# Patient Record
Sex: Male | Born: 1980 | Race: Black or African American | Hispanic: No | Marital: Single | Smoking: Never smoker
Health system: Southern US, Community
[De-identification: ages and names within clinical notes are randomized; demographics above are authoritative.]

## PROBLEM LIST (undated history)

## (undated) DIAGNOSIS — U071 COVID-19: Secondary | ICD-10-CM

---
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
## 2008-08-02 ENCOUNTER — Emergency Department: Payer: Self-pay | Admitting: Emergency Medicine

## 2009-08-25 ENCOUNTER — Emergency Department: Payer: Self-pay | Admitting: Emergency Medicine

## 2009-09-14 ENCOUNTER — Emergency Department: Payer: Self-pay | Admitting: Emergency Medicine

## 2010-01-17 ENCOUNTER — Emergency Department: Payer: Self-pay | Admitting: Emergency Medicine

## 2010-02-24 ENCOUNTER — Emergency Department: Payer: Self-pay | Admitting: Emergency Medicine

## 2010-05-05 ENCOUNTER — Emergency Department: Payer: Self-pay | Admitting: Emergency Medicine

## 2013-03-02 ENCOUNTER — Emergency Department: Payer: Self-pay | Admitting: Internal Medicine

## 2013-12-02 ENCOUNTER — Emergency Department: Payer: Self-pay | Admitting: Emergency Medicine

## 2013-12-24 ENCOUNTER — Emergency Department: Payer: Self-pay | Admitting: Emergency Medicine

## 2016-06-03 ENCOUNTER — Emergency Department
Admission: EM | Admit: 2016-06-03 | Discharge: 2016-06-03 | Disposition: A | Discharge status: Home / Self Care | Payer: Self-pay | Attending: Emergency Medicine | Admitting: Emergency Medicine

## 2016-06-03 ENCOUNTER — Emergency Department: Payer: Self-pay

## 2016-06-03 DIAGNOSIS — N50819 Testicular pain, unspecified: Secondary | ICD-10-CM | POA: Insufficient documentation

## 2016-06-03 DIAGNOSIS — N50812 Left testicular pain: Secondary | ICD-10-CM

## 2016-06-03 LAB — COMPREHENSIVE METABOLIC PANEL
ALT: 18 U/L (ref 17–63)
ANION GAP: 8 (ref 5–15)
AST: 21 U/L (ref 15–41)
Albumin: 4.3 g/dL (ref 3.5–5.0)
Alkaline Phosphatase: 73 U/L (ref 38–126)
BUN: 15 mg/dL (ref 6–20)
CHLORIDE: 105 mmol/L (ref 101–111)
CO2: 24 mmol/L (ref 22–32)
CREATININE: 1.31 mg/dL — AB (ref 0.61–1.24)
Calcium: 9.6 mg/dL (ref 8.9–10.3)
Glucose, Bld: 102 mg/dL — ABNORMAL HIGH (ref 65–99)
POTASSIUM: 3.8 mmol/L (ref 3.5–5.1)
Sodium: 137 mmol/L (ref 135–145)
Total Bilirubin: 0.9 mg/dL (ref 0.3–1.2)
Total Protein: 7.6 g/dL (ref 6.5–8.1)

## 2016-06-03 LAB — URINALYSIS, COMPLETE (UACMP) WITH MICROSCOPIC
BACTERIA UA: NONE SEEN
BILIRUBIN URINE: NEGATIVE
Glucose, UA: NEGATIVE mg/dL
HGB URINE DIPSTICK: NEGATIVE
KETONES UR: NEGATIVE mg/dL
LEUKOCYTES UA: NEGATIVE
NITRITE: NEGATIVE
PROTEIN: NEGATIVE mg/dL
RBC / HPF: NONE SEEN RBC/hpf (ref 0–5)
SPECIFIC GRAVITY, URINE: 1.027 (ref 1.005–1.030)
pH: 5 (ref 5.0–8.0)

## 2016-06-03 LAB — CBC
HEMATOCRIT: 44.3 % (ref 40.0–52.0)
HEMOGLOBIN: 15.4 g/dL (ref 13.0–18.0)
MCH: 29.9 pg (ref 26.0–34.0)
MCHC: 34.8 g/dL (ref 32.0–36.0)
MCV: 86 fL (ref 80.0–100.0)
Platelets: 233 10*3/uL (ref 150–440)
RBC: 5.15 MIL/uL (ref 4.40–5.90)
RDW: 12.9 % (ref 11.5–14.5)
WBC: 4.9 10*3/uL (ref 3.8–10.6)

## 2016-06-03 LAB — LIPASE, BLOOD: LIPASE: 26 U/L (ref 11–51)

## 2016-06-03 LAB — PREGNANCY, URINE: PREG TEST UR: NEGATIVE

## 2016-06-03 NOTE — ED Provider Notes (Signed)
[facility] Emergency Department Provider Note  ____________________________________________   First MD Initiated Contact with Patient 06/03/16 1947     (approximate)  I have reviewed the triage vital signs and the nursing notes.   HISTORY  Chief Complaint Abdominal Pain    HPI Cory Hale. is a 36 y.o. male who comes to the emergency department with 1 month of intermittent mild to moderate aching on top of his right testicle. He notes the pain is worse when standing up or straining and improved with lying flat. Denies dysuria frequency hesitancy. Denies dyspareunia. Denies abdominal pain. Denies nausea or vomiting. Denies previous abdominal surgical history. Declines pain medication at this time.   History reviewed. No pertinent past medical history.  There are no active problems to display for this patient.   History reviewed. No pertinent surgical history.  Prior to Admission medications   Not on File    Allergies Patient has no allergy information on record.  No family history on file.  Social History Social History  Substance Use Topics  . Smoking status: Not on file  . Smokeless tobacco: Not on file  . Alcohol use Not on file    Review of Systems Constitutional: No fever/chills Eyes: No visual changes. ENT: No sore throat. Cardiovascular: Denies chest pain. Respiratory: Denies shortness of breath. Gastrointestinal: No abdominal pain.  No nausea, no vomiting.  No diarrhea.  No constipation. Genitourinary: Negative for dysuria. Musculoskeletal: Negative for back pain. Skin: Negative for rash. Neurological: Negative for headaches, focal weakness or numbness.   ____________________________________________   PHYSICAL EXAM:  VITAL SIGNS: ED Triage Vitals  Enc Vitals Group     BP 06/03/16 1817 135/76     Pulse Rate 06/03/16 1817 95     Resp 06/03/16 1817 18     Temp 06/03/16 1817 97.7 F (36.5 C)     Temp Source  06/03/16 1817 Oral     SpO2 06/03/16 1817 97 %     Weight 06/03/16 1817 177 lb (80.3 kg)     Height 06/03/16 1817 5\' 5"  (1.651 m)     Head Circumference --      Peak Flow --      Pain Score 06/03/16 1816 5     Pain Loc --      Pain Edu? --      Excl. in GC? --     Constitutional: Alert and oriented x 4 well appearing nontoxic no diaphoresis speaks in full, clear sentences Eyes: PERRL EOMI. Head: Atraumatic. Nose: No congestion/rhinnorhea. Mouth/Throat: No trismus Neck: No stridor.   Cardiovascular: Normal rate, regular rhythm. Grossly normal heart sounds.  Good peripheral circulation. Respiratory: Normal respiratory effort.  No retractions. Lungs CTAB and moving good air Gastrointestinal: Soft nondistended nontender no rebound or guarding no peritonitis no McBurney's tenderness negative Rovsing's negative hernia exam he is somewhat tender over his right epididymis Musculoskeletal: No lower extremity edema   Neurologic:  Normal speech and language. No gross focal neurologic deficits are appreciated. Skin:  Skin is warm, dry and intact. No rash noted. Psychiatric: Mood and affect are normal. Speech and behavior are normal.    ____________________________________________   DIFFERENTIAL  Testicular cancer, testicular diversion, epididymitis, inguinal hernia, appendicitis ____________________________________________   LABS (all labs ordered are listed, but only abnormal results are displayed)  Labs Reviewed  COMPREHENSIVE METABOLIC PANEL - Abnormal; Notable for the following:       Result Value   Glucose, Bld 102 (*)  Creatinine, Ser 1.31 (*)    All other components within normal limits  URINALYSIS, COMPLETE (UACMP) WITH MICROSCOPIC - Abnormal; Notable for the following:    Color, Urine YELLOW (*)    APPearance CLEAR (*)    Squamous Epithelial / LPF 0-5 (*)    All other components within normal limits  LIPASE, BLOOD  CBC  PREGNANCY, URINE    Labs unremarkable aside  from slight kidney injury __________________________________________  EKG   ____________________________________________  RADIOLOGY  Ultrasound of the testicle shows no signs of mass and no signs of epididymitis ____________________________________________   PROCEDURES  Procedure(s) performed: no  Procedures  Critical Care performed: no  Observation: no ____________________________________________   INITIAL IMPRESSION / ASSESSMENT AND PLAN / ED COURSE  Pertinent labs & imaging results that were available during my care of the patient were reviewed by me and considered in my medical decision making (see chart for details).  The patient arrives with a completely benign abdominal exam. I don't appreciate any hernias although his story does sound like a hernia. When he describes the mass he feels however it is clearly in his testicle and nondistended as inguinal canal. I obtained an ultrasound of his testicle which is negative for acute pathology. At this point I will refer him to primary care physician.      ____________________________________________   FINAL CLINICAL IMPRESSION(S) / ED DIAGNOSES  Final diagnoses:  Orchalgia      NEW MEDICATIONS STARTED DURING THIS VISIT:  There are no discharge medications for this patient.    Note:  This document was prepared using Dragon voice recognition software and may include unintentional dictation errors.     Merrily Brittle, MD 06/04/16 1513

## 2016-06-03 NOTE — ED Triage Notes (Signed)
Pt c/o of RLQ pain that started couple days ago. Pt denies N/V or diarrhea. Denies dysuria.

## 2016-06-03 NOTE — Discharge Instructions (Signed)
Please make an appointment to follow-up with her primary care physician within the next week or 2 for reevaluation. Return to the emergency department for any concerns such as worsening pain, if you cannot eat or drink, or for any other concerns.  It was a pleasure to take care of you today, and thank you for coming to our emergency department.  If you have any questions or concerns before leaving please ask the nurse to grab me and I'm more than happy to go through your aftercare instructions again.  If you were prescribed any opioid pain medication today such as Norco, Vicodin, Percocet, morphine, hydrocodone, or oxycodone please make sure you do not drive when you are taking this medication as it can alter your ability to drive safely.  If you have any concerns once you are home that you are not improving or are in fact getting worse before you can make it to your follow-up appointment, please do not hesitate to call 911 and come back for further evaluation.  Merrily Brittle MD  Results for orders placed or performed during the hospital encounter of 06/03/16  Lipase, blood  Result Value Ref Range   Lipase 26 11 - 51 U/L  Comprehensive metabolic panel  Result Value Ref Range   Sodium 137 135 - 145 mmol/L   Potassium 3.8 3.5 - 5.1 mmol/L   Chloride 105 101 - 111 mmol/L   CO2 24 22 - 32 mmol/L   Glucose, Bld 102 (H) 65 - 99 mg/dL   BUN 15 6 - 20 mg/dL   Creatinine, Ser 1.61 (H) 0.61 - 1.24 mg/dL   Calcium 9.6 8.9 - 09.6 mg/dL   Total Protein 7.6 6.5 - 8.1 g/dL   Albumin 4.3 3.5 - 5.0 g/dL   AST 21 15 - 41 U/L   ALT 18 17 - 63 U/L   Alkaline Phosphatase 73 38 - 126 U/L   Total Bilirubin 0.9 0.3 - 1.2 mg/dL   GFR calc non Af Amer >60 >60 mL/min   GFR calc Af Amer >60 >60 mL/min   Anion gap 8 5 - 15  CBC  Result Value Ref Range   WBC 4.9 3.8 - 10.6 K/uL   RBC 5.15 4.40 - 5.90 MIL/uL   Hemoglobin 15.4 13.0 - 18.0 g/dL   HCT 04.5 40.9 - 81.1 %   MCV 86.0 80.0 - 100.0 fL   MCH 29.9  26.0 - 34.0 pg   MCHC 34.8 32.0 - 36.0 g/dL   RDW 91.4 78.2 - 95.6 %   Platelets 233 150 - 440 K/uL  Urinalysis, Complete w Microscopic  Result Value Ref Range   Color, Urine YELLOW (A) YELLOW   APPearance CLEAR (A) CLEAR   Specific Gravity, Urine 1.027 1.005 - 1.030   pH 5.0 5.0 - 8.0   Glucose, UA NEGATIVE NEGATIVE mg/dL   Hgb urine dipstick NEGATIVE NEGATIVE   Bilirubin Urine NEGATIVE NEGATIVE   Ketones, ur NEGATIVE NEGATIVE mg/dL   Protein, ur NEGATIVE NEGATIVE mg/dL   Nitrite NEGATIVE NEGATIVE   Leukocytes, UA NEGATIVE NEGATIVE   RBC / HPF NONE SEEN 0 - 5 RBC/hpf   WBC, UA 0-5 0 - 5 WBC/hpf   Bacteria, UA NONE SEEN NONE SEEN   Squamous Epithelial / LPF 0-5 (A) NONE SEEN   Mucous PRESENT   Pregnancy, urine  Result Value Ref Range   Preg Test, Ur NEGATIVE NEGATIVE   US Scrotum  Result Date: 06/03/2016 CLINICAL DATA:  Right testicular pain for  2 days EXAM: SCROTAL ULTRASOUND DOPPLER ULTRASOUND OF THE TESTICLES TECHNIQUE: Complete ultrasound examination of the testicles, epididymis, and other scrotal structures was performed. Color and spectral Doppler ultrasound were also utilized to evaluate blood flow to the testicles. COMPARISON:  None. FINDINGS: Right testicle Measurements: 4.4 x 2.1 x 2.9 cm. No mass or microlithiasis visualized. Left testicle Measurements: 3.8 x 2.5 x 2.7 cm. No mass or microlithiasis visualized. Right epididymis:  Normal in size and appearance. Left epididymis:  Normal in size and appearance. Hydrocele:  Small bilateral hydroceles are noted. Varicocele:  None visualized. Pulsed Doppler interrogation of both testes demonstrates normal low resistance arterial and venous waveforms bilaterally. IMPRESSION: Small bilateral hydroceles.  No testicular abnormality is noted. Electronically Signed   By: Alcide CleverMark  Lukens M.D.   On: 06/03/2016 21:32   [place] Art/ven Flow Abd Pelv Doppler  Result Date: 06/03/2016 CLINICAL DATA:  Right testicular pain for 2 days EXAM: SCROTAL  ULTRASOUND DOPPLER ULTRASOUND OF THE TESTICLES TECHNIQUE: Complete ultrasound examination of the testicles, epididymis, and other scrotal structures was performed. Color and spectral Doppler ultrasound were also utilized to evaluate blood flow to the testicles. COMPARISON:  None. FINDINGS: Right testicle Measurements: 4.4 x 2.1 x 2.9 cm. No mass or microlithiasis visualized. Left testicle Measurements: 3.8 x 2.5 x 2.7 cm. No mass or microlithiasis visualized. Right epididymis:  Normal in size and appearance. Left epididymis:  Normal in size and appearance. Hydrocele:  Small bilateral hydroceles are noted. Varicocele:  None visualized. Pulsed Doppler interrogation of both testes demonstrates normal low resistance arterial and venous waveforms bilaterally. IMPRESSION: Small bilateral hydroceles.  No testicular abnormality is noted. Electronically Signed   By: Alcide CleverMark  Lukens M.D.   On: 06/03/2016 21:32

## 2018-10-16 IMAGING — US US SCROTUM
1 series · 14 of 25 positions shown · non-contrast
Comparison: None.

CLINICAL DATA: Right testicular pain for 2 days

EXAM:
SCROTAL ULTRASOUND
DOPPLER ULTRASOUND OF THE TESTICLES
TECHNIQUE: Complete ultrasound examination of the testicles, epididymis, and
other scrotal structures was performed. Color and spectral Doppler
ultrasound were also utilized to evaluate blood flow to the
testicles.

[Series 1: us scrotum · 0.08mm/px · 14 of 63 slices shown]
[im 1/63]
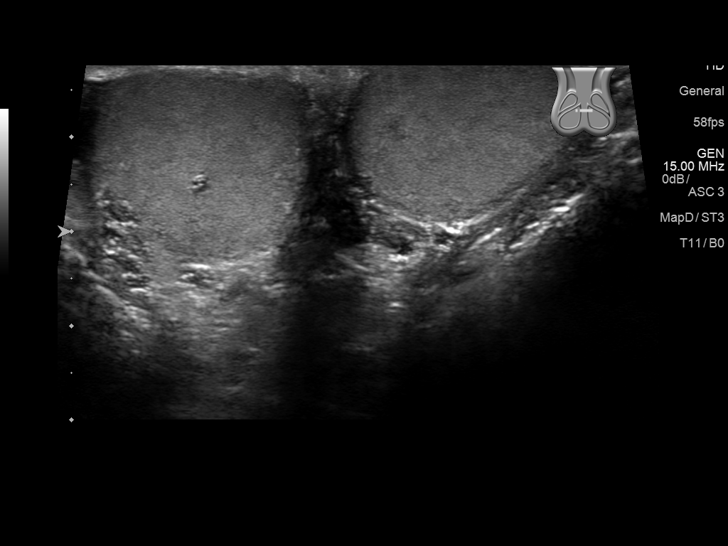
[im 6/63]
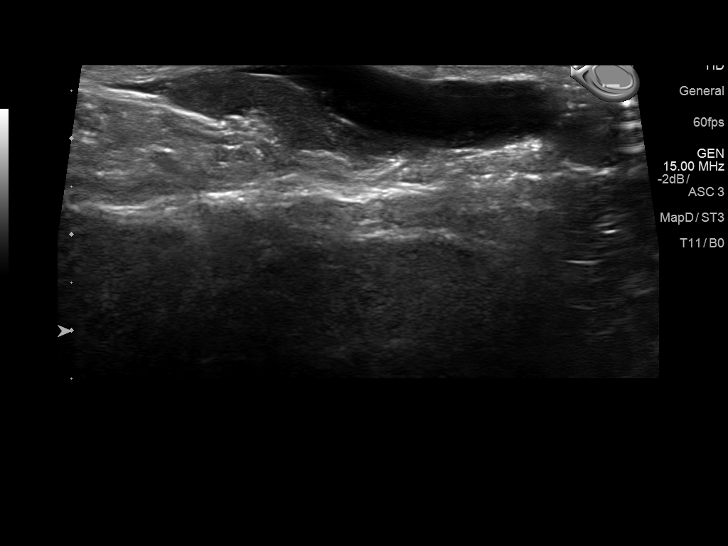
[im 11/63]
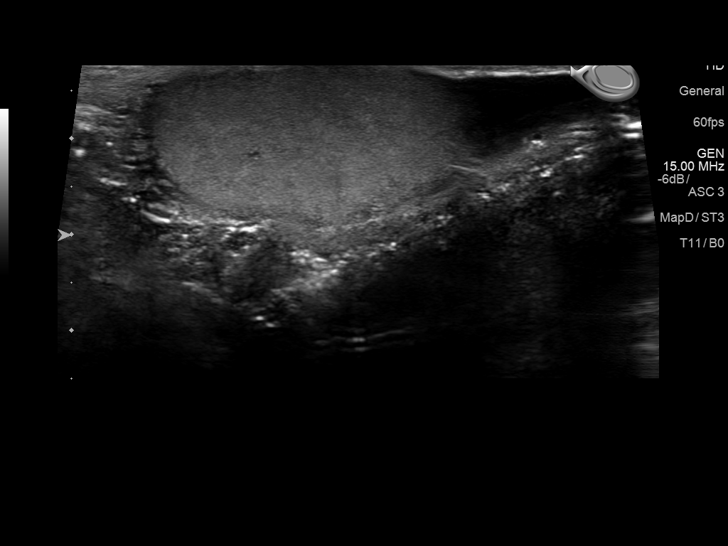
[im 16/63]
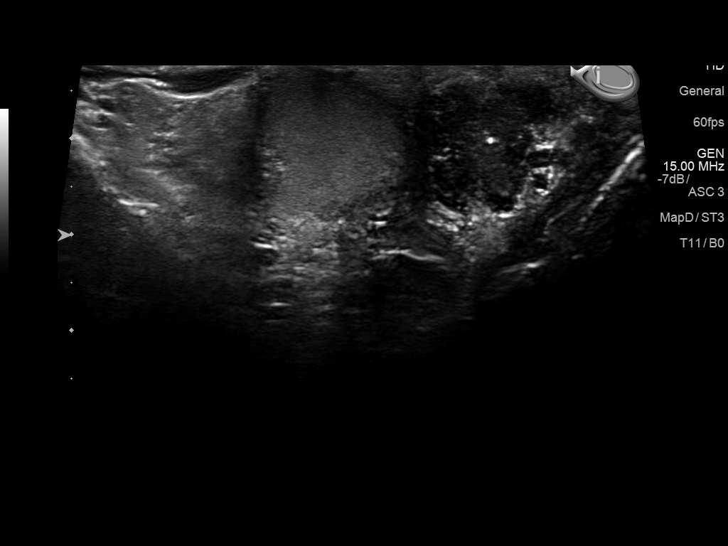
[im 21/63]
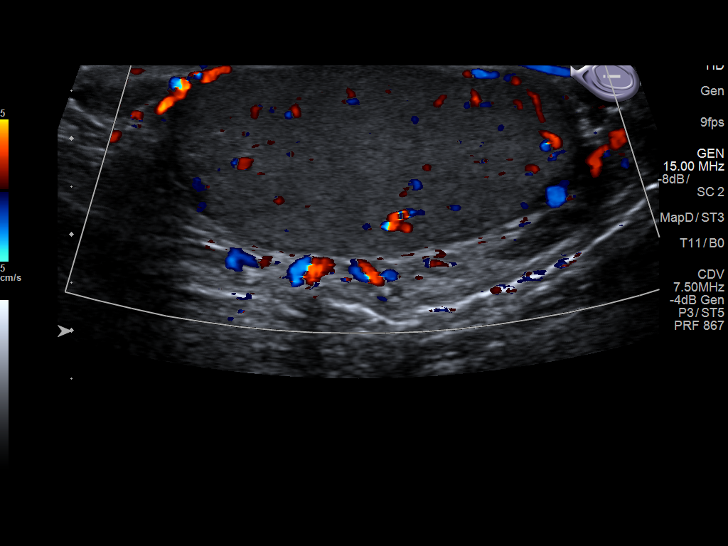
[im 24/63]
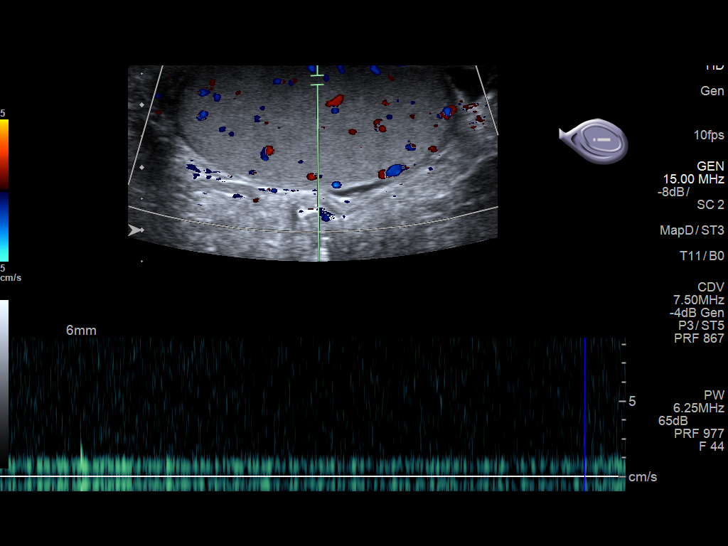
[im 29/63]
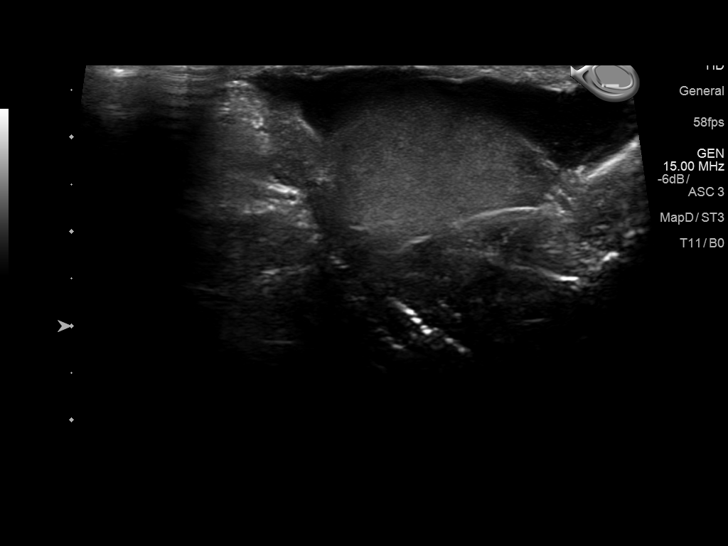
[im 34/63]
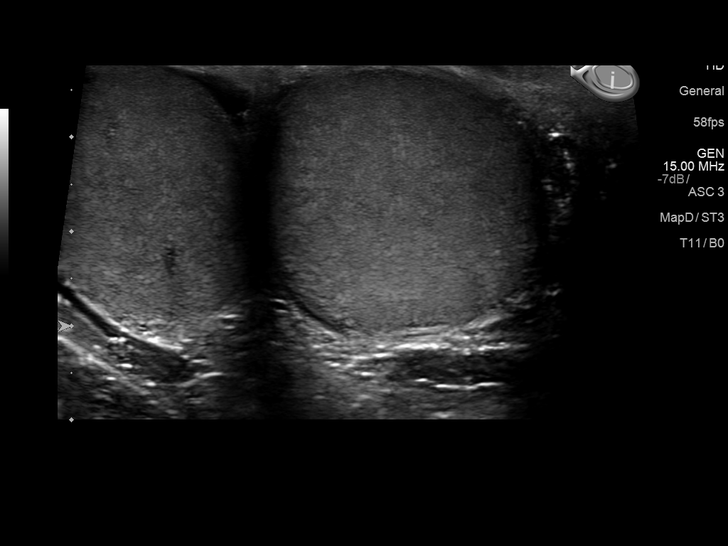
[im 39/63]
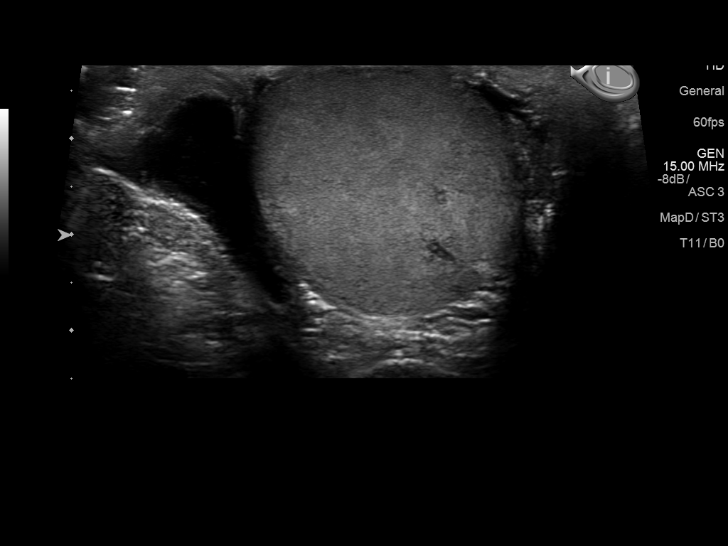
[im 42/63]
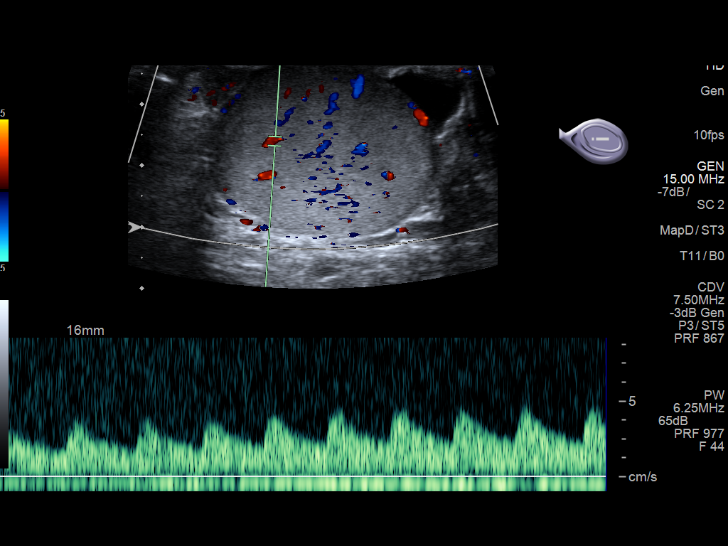
[im 47/63]
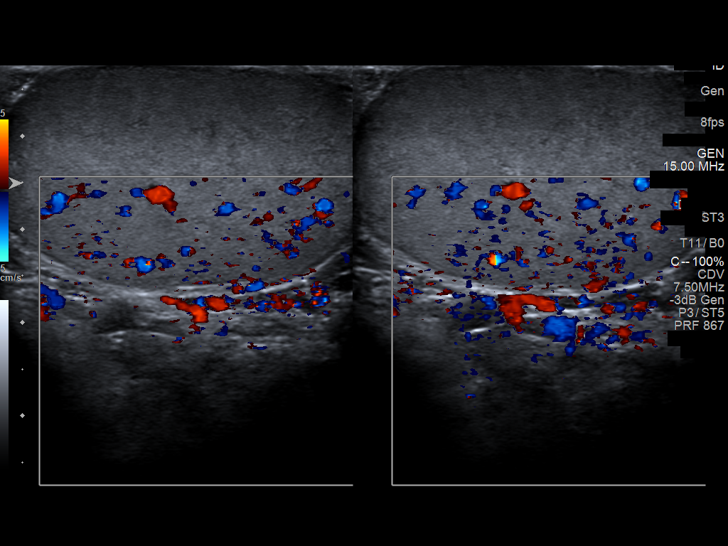
[im 52/63]
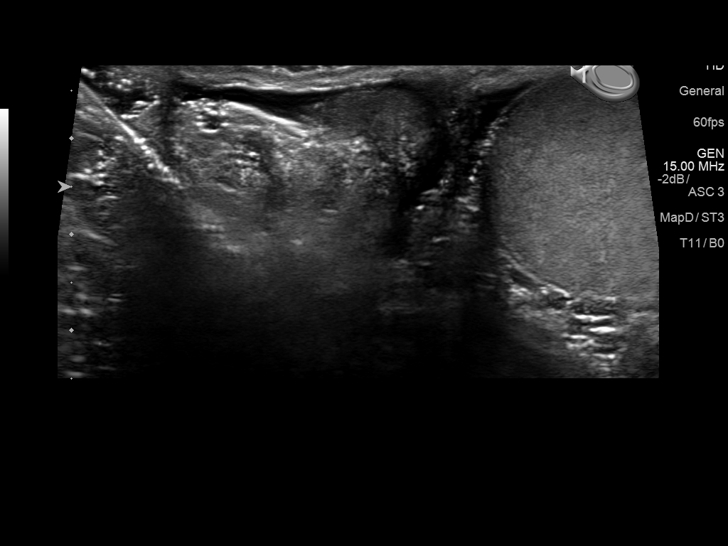
[im 57/63]
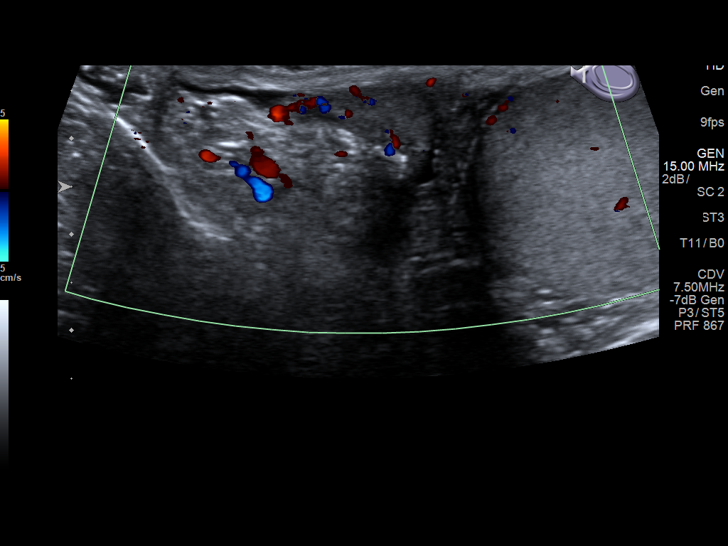
[im 63/63]
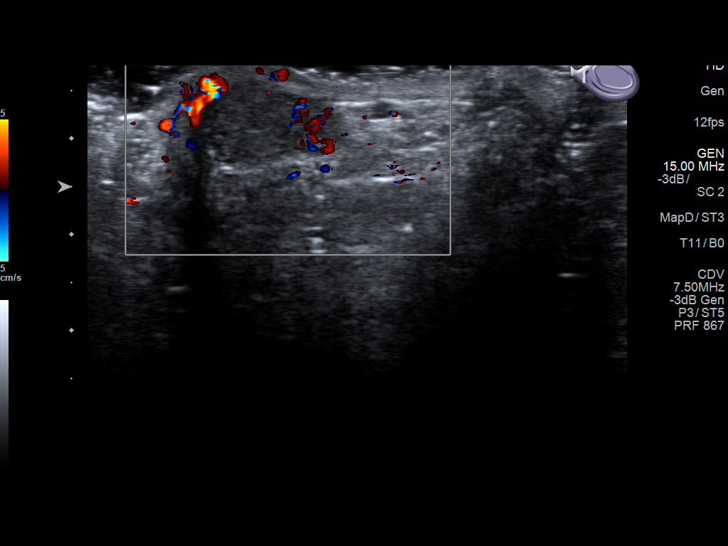

[14 of 25 positions shown; findings below may reference images not displayed]

FINDINGS: Right testicle

Measurements: 4.4 x 2.1 x 2.9 cm.. No mass or microlithiasis
visualized.

Left testicle

Measurements: 3.8 x 2.5 x 2.7 cm.. No mass or microlithiasis
visualized.

Right epididymis:  Normal in size and appearance.

Left epididymis:  Normal in size and appearance.

Hydrocele:  Small bilateral hydroceles are noted.

Varicocele:  None visualized.

Pulsed Doppler interrogation of both testes demonstrates normal low
resistance arterial and venous waveforms bilaterally.
IMPRESSION: Small bilateral hydroceles.  No testicular abnormality is noted.

## 2020-07-13 ENCOUNTER — Other Ambulatory Visit: Payer: Self-pay | Admitting: Orthopedic Surgery

## 2020-07-16 ENCOUNTER — Other Ambulatory Visit
Admission: RE | Admit: 2020-07-16 | Discharge: 2020-07-16 | Disposition: A | Discharge status: Home / Self Care | Payer: 59 | Source: Ambulatory Visit | Attending: Orthopedic Surgery | Admitting: Orthopedic Surgery

## 2020-07-16 ENCOUNTER — Other Ambulatory Visit: Payer: Self-pay

## 2020-07-16 HISTORY — DX: COVID-19: U07.1

## 2020-07-16 NOTE — Patient Instructions (Addendum)
Your procedure is scheduled on: 07/22/20 -  Thursday Report to the Registration Desk on the 1st floor of the [facility]. To find out your arrival time, please call [phone] between 1PM - 3PM on: 07/21/20 - Wednesday  REMEMBER: Instructions that are not followed completely may result in serious medical risk, up to and including death; or upon the discretion of your surgeon and anesthesiologist your surgery may need to be rescheduled.  Do not eat food after midnight the night before surgery.  No gum chewing, lozengers or hard candies.  You may however, drink CLEAR liquids up to 2 hours before you are scheduled to arrive for your surgery. Do not drink anything within 2 hours of your scheduled arrival time.  Clear liquids include: - water  - apple juice without pulp - gatorade (not RED, PURPLE, OR BLUE) - black coffee or tea (Do NOT add milk or creamers to the coffee or tea) Do NOT drink anything that is not on this list.  TAKE THESE MEDICATIONS THE MORNING OF SURGERY WITH A SIP OF WATER: NONE  One week prior to surgery: Stop Anti-inflammatories (NSAIDS) such as Advil, Aleve, Ibuprofen, Motrin, Naproxen, Naprosyn and Aspirin based products such as Excedrin, Goodys Powder, BC Powder.  Stop ANY OVER THE COUNTER supplements until after surgery.  You may however, continue to take Tylenol if needed for pain up until the day of surgery.  No Alcohol for 24 hours before or after surgery.  No Smoking including e-cigarettes for 24 hours prior to surgery.  No chewable tobacco products for at least 6 hours prior to surgery.  No nicotine patches on the day of surgery.  Do not use any "recreational" drugs for at least a week prior to your surgery.  Please be advised that the combination of cocaine and anesthesia may have negative outcomes, up to and including death. If you test positive for cocaine, your surgery will be cancelled.  On the morning of surgery brush your teeth with  toothpaste and water, you may rinse your mouth with mouthwash if you wish. Do not swallow any toothpaste or mouthwash.  Do not wear jewelry, make-up, hairpins, clips or nail polish.  Do not wear lotions, powders, or perfumes.   Do not shave body from the neck down 48 hours prior to surgery just in case you cut yourself which could leave a site for infection.  Also, freshly shaved skin may become irritated if using the CHG soap.  Contact lenses, hearing aids and dentures may not be worn into surgery.  Do not bring valuables to the hospital. [facility] is not responsible for any missing/lost belongings or valuables.   Notify your doctor if there is any change in your medical condition (cold, fever, infection).  Wear comfortable clothing (specific to your surgery type) to the hospital.  After surgery, you can help prevent lung complications by doing breathing exercises.  Take deep breaths and cough every 1-2 hours. Your doctor may order a device called an Incentive Spirometer to help you take deep breaths. When coughing or sneezing, hold a pillow firmly against your incision with both hands. This is called "splinting." Doing this helps protect your incision. It also decreases belly discomfort.  If you are being admitted to the hospital overnight, leave your suitcase in the car. After surgery it may be brought to your room.  If you are being discharged the day of surgery, you will not be allowed to drive home. You will need a responsible adult (18  years or older) to drive you home and stay with you that night.   If you are taking public transportation, you will need to have a responsible adult (18 years or older) with you. Please confirm with your physician that it is acceptable to use public transportation.   Please call the Pre-admissions Testing Dept. at [phone] if you have any questions about these instructions.  Surgery Visitation Policy:  Patients undergoing a surgery or  procedure may have one family member or support person with them as long as that person is not COVID-19 positive or experiencing its symptoms.  That person may remain in the waiting area during the procedure.  Inpatient Visitation:    Visiting hours are 7 a.m. to 8 p.m. Inpatients will be allowed two visitors daily. The visitors may change each day during the patient's stay. No visitors under the age of 53. Any visitor under the age of 26 must be accompanied by an adult. The visitor must pass COVID-19 screenings, use hand sanitizer when entering and exiting the patient's room and wear a mask at all times, including in the patient's room. Patients must also wear a mask when staff or their visitor are in the room. Masking is required regardless of vaccination status.

## 2020-07-22 ENCOUNTER — Ambulatory Visit: Payer: 59 | Admitting: Certified Registered"

## 2020-07-22 ENCOUNTER — Ambulatory Visit
Admission: RE | Admit: 2020-07-22 | Discharge: 2020-07-22 | Disposition: A | Discharge status: Home / Self Care | Payer: 59 | Attending: Orthopedic Surgery | Admitting: Orthopedic Surgery

## 2020-07-22 ENCOUNTER — Encounter: Payer: Self-pay | Admitting: Orthopedic Surgery

## 2020-07-22 ENCOUNTER — Other Ambulatory Visit: Payer: Self-pay

## 2020-07-22 ENCOUNTER — Encounter
Admission: RE | Disposition: A | Discharge status: Home / Self Care | Payer: Self-pay | Source: Home / Self Care | Attending: Orthopedic Surgery

## 2020-07-22 DIAGNOSIS — M67431 Ganglion, right wrist: Secondary | ICD-10-CM | POA: Diagnosis not present

## 2020-07-22 DIAGNOSIS — G5601 Carpal tunnel syndrome, right upper limb: Secondary | ICD-10-CM | POA: Insufficient documentation

## 2020-07-22 HISTORY — PX: CARPAL TUNNEL RELEASE: SHX101

## 2020-07-22 SURGERY — CARPAL TUNNEL RELEASE
Anesthesia: General | Laterality: Right

## 2020-07-22 MED ORDER — BUPIVACAINE HCL (PF) 0.5 % IJ SOLN
INTRAMUSCULAR | Status: AC
  Filled 2020-07-22: qty 30

## 2020-07-22 MED ORDER — ONDANSETRON HCL 4 MG/2ML IJ SOLN
INTRAMUSCULAR | Status: DC | PRN
  Administered 2020-07-22: 4 mg via INTRAVENOUS

## 2020-07-22 MED ORDER — CHLORHEXIDINE GLUCONATE 0.12 % MT SOLN
OROMUCOSAL | Status: AC
  Administered 2020-07-22: 15 mL via OROMUCOSAL
  Filled 2020-07-22: qty 15

## 2020-07-22 MED ORDER — FAMOTIDINE 20 MG PO TABS
ORAL_TABLET | ORAL | Status: AC
  Administered 2020-07-22: 20 mg via ORAL
  Filled 2020-07-22: qty 1

## 2020-07-22 MED ORDER — ONDANSETRON HCL 4 MG/2ML IJ SOLN
INTRAMUSCULAR | Status: AC
  Filled 2020-07-22: qty 2

## 2020-07-22 MED ORDER — ONDANSETRON HCL 4 MG PO TABS: 4.0000 mg | ORAL_TABLET | Freq: Four times a day (QID) | ORAL | Status: DC | PRN

## 2020-07-22 MED ORDER — DEXMEDETOMIDINE (PRECEDEX) IN NS 20 MCG/5ML (4 MCG/ML) IV SYRINGE
PREFILLED_SYRINGE | INTRAVENOUS | Status: DC | PRN
  Administered 2020-07-22: 8 ug via INTRAVENOUS

## 2020-07-22 MED ORDER — CHLORHEXIDINE GLUCONATE 0.12 % MT SOLN: 15.0000 mL | Freq: Once | OROMUCOSAL | Status: AC

## 2020-07-22 MED ORDER — OXYCODONE HCL 5 MG PO TABS: 5.0000 mg | ORAL_TABLET | Freq: Once | ORAL | Status: DC | PRN

## 2020-07-22 MED ORDER — OXYCODONE HCL 5 MG/5ML PO SOLN: 5.0000 mg | Freq: Once | ORAL | Status: DC | PRN

## 2020-07-22 MED ORDER — FENTANYL CITRATE (PF) 100 MCG/2ML IJ SOLN
INTRAMUSCULAR | Status: DC | PRN
  Administered 2020-07-22 (×2): 50 ug via INTRAVENOUS

## 2020-07-22 MED ORDER — HYDROCODONE-ACETAMINOPHEN 5-325 MG PO TABS: 1.0000 | ORAL_TABLET | Freq: Four times a day (QID) | ORAL | 0 refills | Status: AC | PRN

## 2020-07-22 MED ORDER — LACTATED RINGERS IV SOLN: INTRAVENOUS | Status: DC

## 2020-07-22 MED ORDER — CEFAZOLIN SODIUM-DEXTROSE 2-4 GM/100ML-% IV SOLN
2.0000 g | INTRAVENOUS | Status: AC
  Administered 2020-07-22: 2 g via INTRAVENOUS

## 2020-07-22 MED ORDER — FENTANYL CITRATE (PF) 100 MCG/2ML IJ SOLN
INTRAMUSCULAR | Status: AC
  Filled 2020-07-22: qty 2

## 2020-07-22 MED ORDER — PHENYLEPHRINE HCL (PRESSORS) 10 MG/ML IV SOLN
INTRAVENOUS | Status: DC | PRN
  Administered 2020-07-22 (×2): 100 ug via INTRAVENOUS

## 2020-07-22 MED ORDER — ONDANSETRON HCL 4 MG/2ML IJ SOLN: 4.0000 mg | Freq: Four times a day (QID) | INTRAMUSCULAR | Status: DC | PRN

## 2020-07-22 MED ORDER — METOCLOPRAMIDE HCL 5 MG/ML IJ SOLN: 5.0000 mg | Freq: Three times a day (TID) | INTRAMUSCULAR | Status: DC | PRN

## 2020-07-22 MED ORDER — LIDOCAINE HCL (CARDIAC) PF 100 MG/5ML IV SOSY
PREFILLED_SYRINGE | INTRAVENOUS | Status: DC | PRN
  Administered 2020-07-22: 80 mg via INTRAVENOUS

## 2020-07-22 MED ORDER — PROPOFOL 10 MG/ML IV BOLUS
INTRAVENOUS | Status: AC
  Filled 2020-07-22: qty 20

## 2020-07-22 MED ORDER — METOCLOPRAMIDE HCL 10 MG PO TABS: 5.0000 mg | ORAL_TABLET | Freq: Three times a day (TID) | ORAL | Status: DC | PRN

## 2020-07-22 MED ORDER — FENTANYL CITRATE (PF) 100 MCG/2ML IJ SOLN: 25.0000 ug | INTRAMUSCULAR | Status: DC | PRN

## 2020-07-22 MED ORDER — PROPOFOL 10 MG/ML IV BOLUS
INTRAVENOUS | Status: DC | PRN
  Administered 2020-07-22: 200 mg via INTRAVENOUS

## 2020-07-22 MED ORDER — CEFAZOLIN SODIUM-DEXTROSE 2-4 GM/100ML-% IV SOLN
INTRAVENOUS | Status: AC
  Filled 2020-07-22: qty 100

## 2020-07-22 MED ORDER — DEXAMETHASONE SODIUM PHOSPHATE 10 MG/ML IJ SOLN
INTRAMUSCULAR | Status: DC | PRN
  Administered 2020-07-22: 10 mg via INTRAVENOUS

## 2020-07-22 MED ORDER — FAMOTIDINE 20 MG PO TABS: 20.0000 mg | ORAL_TABLET | Freq: Once | ORAL | Status: AC

## 2020-07-22 MED ORDER — BUPIVACAINE HCL 0.5 % IJ SOLN
INTRAMUSCULAR | Status: DC | PRN
  Administered 2020-07-22: 10 mL

## 2020-07-22 MED ORDER — MIDAZOLAM HCL 2 MG/2ML IJ SOLN
INTRAMUSCULAR | Status: AC
  Filled 2020-07-22: qty 2

## 2020-07-22 MED ORDER — 0.9 % SODIUM CHLORIDE (POUR BTL) OPTIME
TOPICAL | Status: DC | PRN
  Administered 2020-07-22: 1000 mL

## 2020-07-22 MED ORDER — ORAL CARE MOUTH RINSE: 15.0000 mL | Freq: Once | OROMUCOSAL | Status: AC

## 2020-07-22 MED ORDER — SODIUM CHLORIDE 0.9 % IV SOLN: INTRAVENOUS | Status: DC

## 2020-07-22 MED ORDER — MIDAZOLAM HCL 2 MG/2ML IJ SOLN
INTRAMUSCULAR | Status: DC | PRN
  Administered 2020-07-22: 2 mg via INTRAVENOUS

## 2020-07-22 SURGICAL SUPPLY — 25 items
APL PRP STRL LF DISP 70% ISPRP (MISCELLANEOUS) ×1
BNDG ELASTIC 3X5.8 VLCR STR LF (GAUZE/BANDAGES/DRESSINGS) ×2 IMPLANT
CANISTER SUCT 1200ML W/VALVE (MISCELLANEOUS) ×2 IMPLANT
CHLORAPREP W/TINT 26 (MISCELLANEOUS) ×2 IMPLANT
CUFF TOURN SGL QUICK 18X4 (TOURNIQUET CUFF) ×2 IMPLANT
ELECT CAUTERY NEEDLE 2.0 MIC (NEEDLE) IMPLANT
GAUZE 4X4 16PLY ~~LOC~~+RFID DBL (SPONGE) ×2 IMPLANT
GAUZE SPONGE 4X4 12PLY STRL (GAUZE/BANDAGES/DRESSINGS) ×2 IMPLANT
GAUZE XEROFORM 1X8 LF (GAUZE/BANDAGES/DRESSINGS) ×2 IMPLANT
GLOVE SURG SYN 9.0  PF PI (GLOVE) ×1
GLOVE SURG SYN 9.0 PF PI (GLOVE) ×1 IMPLANT
GOWN SRG 2XL LVL 4 RGLN SLV (GOWNS) ×1 IMPLANT
GOWN STRL NON-REIN 2XL LVL4 (GOWNS) ×2
GOWN STRL REUS W/ TWL LRG LVL3 (GOWN DISPOSABLE) ×1 IMPLANT
GOWN STRL REUS W/TWL LRG LVL3 (GOWN DISPOSABLE) ×2
KIT TURNOVER KIT A (KITS) ×2 IMPLANT
MANIFOLD NEPTUNE II (INSTRUMENTS) ×2 IMPLANT
NS IRRIG 500ML POUR BTL (IV SOLUTION) ×2 IMPLANT
PACK EXTREMITY ARMC (MISCELLANEOUS) ×2 IMPLANT
PAD CAST CTTN 4X4 STRL (SOFTGOODS) ×1 IMPLANT
PADDING CAST COTTON 4X4 STRL (SOFTGOODS) ×2
SCALPEL PROTECTED #15 DISP (BLADE) ×4 IMPLANT
SUT ETHILON 4-0 (SUTURE) ×2
SUT ETHILON 4-0 FS2 18XMFL BLK (SUTURE) ×1
SUTURE ETHLN 4-0 FS2 18XMF BLK (SUTURE) ×1 IMPLANT

## 2020-07-22 NOTE — Transfer of Care (Signed)
Immediate Anesthesia Transfer of Care Note  Patient: Cory Hale.  Procedure(s) Performed: CARPAL TUNNEL RELEASE (Right)  Patient Location: PACU  Anesthesia Type:General  Level of Consciousness: sedated  Airway & Oxygen Therapy: Patient Spontanous Breathing and Patient connected to face mask oxygen  Post-op Assessment: Report given to RN and Post -op Vital signs reviewed and stable  Post vital signs: Reviewed  Last Vitals:  Vitals Value Taken Time  BP    Temp    Pulse    Resp    SpO2      Last Pain:  Vitals:   07/22/20 1038  TempSrc: Oral  PainSc: 0-No pain         Complications: No notable events documented.

## 2020-07-22 NOTE — Anesthesia Procedure Notes (Signed)
Procedure Name: LMA Insertion Date/Time: 07/22/2020 12:55 PM Performed by: Alanson Puls, RN Pre-anesthesia Checklist: Patient identified, Patient being monitored, Timeout performed, Emergency Drugs available and Suction available Patient Re-evaluated:Patient Re-evaluated prior to induction Oxygen Delivery Method: Circle system utilized Preoxygenation: Pre-oxygenation with 100% oxygen Induction Type: IV induction Ventilation: Mask ventilation without difficulty LMA: LMA inserted LMA Size: 4.0 Tube type: Oral Number of attempts: 1 Placement Confirmation: positive ETCO2 and breath sounds checked- equal and bilateral Tube secured with: Tape Dental Injury: Teeth and Oropharynx as per pre-operative assessment
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
## 2020-07-22 NOTE — Anesthesia Postprocedure Evaluation (Signed)
Anesthesia Post Note  Patient: Cory Hale.  Procedure(s) Performed: CARPAL TUNNEL RELEASE (Right)  Patient location during evaluation: PACU Anesthesia Type: General Level of consciousness: awake and alert Pain management: pain level controlled Vital Signs Assessment: post-procedure vital signs reviewed and stable Respiratory status: spontaneous breathing, nonlabored ventilation, respiratory function stable and patient connected to nasal cannula oxygen Cardiovascular status: blood pressure returned to baseline and stable Postop Assessment: no apparent nausea or vomiting Anesthetic complications: no   No notable events documented.   Last Vitals:  Vitals:   07/22/20 1345 07/22/20 1400  BP: 118/77 113/79  Pulse: 65 70  Resp: 12 17  Temp:    SpO2: 97% 94%    Last Pain:  Vitals:   07/22/20 1400  TempSrc:   PainSc: 0-No pain                 Cleda Mccreedy Andreana Klingerman

## 2020-07-22 NOTE — Op Note (Signed)
07/22/2020  1:47 PM  PATIENT:  Cory Hale.  40 y.o. 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  POST-OPERATIVE DIAGNOSIS:  Carpal tunnel syndrome, right  PROCEDURE:  Procedure(s): CARPAL TUNNEL RELEASE (Right)  SURGEON: Leitha Schuller, MD  ASSISTANTS: None  ANESTHESIA:   general  EBL:  Total I/O In: 500 [I.V.:500] Out: 10 [Blood:10]  BLOOD ADMINISTERED:none  DRAINS: none   LOCAL MEDICATIONS USED:  MARCAINE     SPECIMEN:  No Specimen  DISPOSITION OF SPECIMEN:  N/A  COUNTS:  YES  TOURNIQUET:   Total Tourniquet Time Documented: Upper Arm (Right) - 14 minutes Total: Upper Arm (Right) - 14 minutes   IMPLANTS: None  DICTATION: .Dragon Dictation patient was brought to the operating room and after adequate anesthesia was obtained the right arm was prepped and draped in usual sterile fashion.  After patient identification and timeout procedures tourniquet was raised to 2 or 50 mmHg.  A volar incision was made in line of the ring metacarpal approximately 2 cm in length and subsequently extended proximally across the wrist flexion crease to get adequate release.  Subcutaneous tissue was spread and the transcarpal ligament identified and opened in 1 small area with a vascular hemostat placed deep to protect the underlying structures release was carried out distally until fat was noted around the nerve.  Proximally there is a very thick band and the antebrachial fascia and slight extension of a few millimeters was made into the distal forearm angling this across the wrist crease this gave better exposure and complete release was obtained with good vascular blush to the nerve following this.  There was a small area of the visible compression to the nerve at the level just proximal to the wrist flexion crease.  The wound was then irrigated and infiltrated 10 cc of half percent Sensorcaine followed by wound closure with simple erupted 4-0 nylon skin  closure.  Xeroform 4 x 4 web roll and Ace wrap applied before tourniquet let down.  PLAN OF CARE: Discharge to home after PACU  PATIENT DISPOSITION:  PACU - hemodynamically stable.

## 2020-07-22 NOTE — H&P (Signed)
   Chief Complaint  Patient presents with   Right Wrist - Numbness    History of the Present Illness: Cory Hale. is a 40 y.o. right-hand-dominant male here today.   The patient presents for evaluation of right carpal tunnel syndrome and ganglion. The nerve test demonstrated mild carpal tunnel, but he is experiencing many symptoms. He presents today to discuss possible surgery.  The patient locates his pain to the right wrist. He states if he is doing some strenuous work, he starts to feel the pain. He has had tingling and numbness into his fingers. He notes that he has not been working but has been active around the house with yardwork particularly.  The patient is not on any blood thinners. He is otherwise healthy.  The patient is employed as a Psychologist, occupational.   I have reviewed past medical, surgical, social and family history, and allergies as documented in the EMR.  Past Medical History: History reviewed. No pertinent past medical history.  Past Surgical History: Past Surgical History:  Procedure Laterality Date   ankle and knee surgery   Past Family History: Family History  Problem Relation Age of Onset   No Known Problems Mother   Medications: No current Epic-ordered outpatient medications on file.   No current Epic-ordered facility-administered medications on file.   Allergies: No Known Allergies   Body mass index is 31.99 kg/m.  Review of Systems: A comprehensive 14 point ROS was performed, reviewed, and the pertinent orthopaedic findings are documented in the HPI.  Vitals:  07/09/20 0751  BP: 136/78    General Physical Examination:   General/Constitutional: No apparent distress: well-nourished and well developed. Eyes: Pupils equal, round with synchronous movement. Lungs: Clear to auscultation HEENT: Normal Vascular: No edema, swelling or tenderness, except as noted in detailed exam. Cardiac: Heart rate and rhythm is regular. Integumentary: No  impressive skin lesions present, except as noted in detailed exam. Neuro/Psych: Normal mood and affect, oriented to person, place and time.  Musculoskeletal Examination:   On exam, tender ganglion cyst to the right wrist. Negative Tinel sign on the right. Positive Phalen test on the right. Good right thumb opposition.  Radiographs:  No results were obtained or interpreted today.  Assessment: ICD-10-CM  1. Carpal tunnel syndrome, right G56.01  2. Ganglion cyst of volar aspect of right wrist M67.431   Plan:  The patient has clinical findings of right carpal tunnel syndrome and ganglion cyst.  We discussed the patient's prior EMG nerve conduction study findings. I explained he has carpal tunnel syndrome. I gave the patient a video on carpal tunnel syndrome. I recommend right carpal tunnel release and ganglion cyst removal. I explained the surgery and postoperative course in detail.  We will schedule the patient for surgery next week.  Surgical Risks:  The nature of the condition and the proposed procedure has been reviewed in detail with the patient. Surgical versus non-surgical options and prognosis for recovery have been reviewed and the inherent risks and benefits of each have been discussed including the risks of infection, bleeding, injury to nerves/blood vessels/tendons, incomplete relief of symptoms, persisting pain and/or stiffness, loss of function, complex regional pain syndrome, failure of the procedure, as appropriate.  Attestation: Marvis Moeller, am documenting for [facility], MD utilizing [employer] DAX.    Electronically signed by Marlena Clipper, MD at 07/10/2020 5:06 PM EDT  Reviewed  H+P. No changes noted.

## 2020-07-22 NOTE — Discharge Instructions (Addendum)
Keep dressing clean and dry. Loosen Ace wrap prior to dismissal today and if fingers swell through the weekend but leave underlying wrap alone. Pain medicine as directed Work on finger motion is much as you can to prevent stiffness. Call office if you are having problems  AMBULATORY SURGERY  DISCHARGE INSTRUCTIONS   The drugs that you were given will stay in your system until tomorrow so for the next 24 hours you should not:  Drive an automobile Make any legal decisions Drink any alcoholic beverage   You may resume regular meals tomorrow.  Today it is better to start with liquids and gradually work up to solid foods.  You may eat anything you prefer, but it is better to start with liquids, then soup and crackers, and gradually work up to solid foods.   Please notify your doctor immediately if you have any unusual bleeding, trouble breathing, redness and pain at the surgery site, drainage, fever, or pain not relieved by medication.    Additional Instructions:        Please contact your physician with any problems or Same Day Surgery at [phone], Monday through Friday 6 am to 4 pm, or [facility] number at [phone].
